# Patient Record
Sex: Male | Born: 1985 | Race: White | Hispanic: No | Marital: Single | Smoking: Never smoker
Health system: Southern US, Community
[De-identification: ages and names within clinical notes are randomized; demographics above are authoritative.]

## PROBLEM LIST (undated history)

## (undated) DIAGNOSIS — R0609 Other forms of dyspnea: Secondary | ICD-10-CM

## (undated) DIAGNOSIS — R0789 Other chest pain: Secondary | ICD-10-CM

## (undated) DIAGNOSIS — N390 Urinary tract infection, site not specified: Secondary | ICD-10-CM

## (undated) DIAGNOSIS — E781 Pure hyperglyceridemia: Secondary | ICD-10-CM

## (undated) DIAGNOSIS — E669 Obesity, unspecified: Secondary | ICD-10-CM

## (undated) HISTORY — DX: Obesity, unspecified: E66.9

## (undated) HISTORY — DX: Urinary tract infection, site not specified: N39.0

## (undated) HISTORY — DX: Other chest pain: R07.89

## (undated) HISTORY — DX: Other forms of dyspnea: R06.09

## (undated) HISTORY — DX: Pure hyperglyceridemia: E78.1

## (undated) HISTORY — PX: OTHER SURGICAL HISTORY: SHX169

---

## 2017-07-05 ENCOUNTER — Encounter: Payer: Self-pay | Admitting: Cardiology

## 2017-07-05 ENCOUNTER — Ambulatory Visit (INDEPENDENT_AMBULATORY_CARE_PROVIDER_SITE_OTHER): Payer: Self-pay | Admitting: Cardiology

## 2017-07-05 ENCOUNTER — Encounter: Payer: Self-pay | Admitting: *Deleted

## 2017-07-05 DIAGNOSIS — R0789 Other chest pain: Secondary | ICD-10-CM | POA: Insufficient documentation

## 2017-07-05 DIAGNOSIS — R0600 Dyspnea, unspecified: Secondary | ICD-10-CM

## 2017-07-05 DIAGNOSIS — R0609 Other forms of dyspnea: Secondary | ICD-10-CM

## 2017-07-05 HISTORY — DX: Other forms of dyspnea: R06.09

## 2017-07-05 HISTORY — DX: Dyspnea, unspecified: R06.00

## 2017-07-05 HISTORY — DX: Other chest pain: R07.89

## 2017-07-05 MED ORDER — METOPROLOL TARTRATE 25 MG PO TABS: 25.0000 mg | ORAL_TABLET | Freq: Two times a day (BID) | ORAL | 3 refills | Status: DC

## 2017-07-05 NOTE — Patient Instructions (Signed)
Medication Instructions:  Your physician has recommended you make the following change in your medication:  START metoprolol tartrate (lopressor) 25 mg twice daily  Labwork: Your physician recommends that you return for lab work today: lipid panel  Testing/Procedures: You had an EKG today.   Your physician has requested that you have en exercise stress myoview. For further information please visit [email or web address]. Please follow instruction sheet, as given.    Follow-Up: Your physician recommends that you schedule a follow-up appointment in: 1 month.  Any Other Special Instructions Will Be Listed Below (If Applicable).     If you need a refill on your cardiac medications before your next appointment, please call your pharmacy.

## 2017-07-05 NOTE — Progress Notes (Signed)
Cardiology Consultation:    Date:  07/05/2017   ID:  David Schultz, DOB 12/07/1985, MRN [id number]  PCP:  Patient, No Pcp Per  Cardiologist:  Gypsy Balsam, MD   Referring MD: No ref. provider found   Chief Complaint  Patient presents with  . Chest Pain    x2 weeks  I am having chest pain  History of Present Illness:    David Schultz is a 32 y.o. male who is being seen today for the evaluation of chest pain at the request of No ref. provider found.  He started experiencing chest pain about 6 months ago.  He is a Corporate investment banker and initially pain was when he was trying to do some heavy work.  Described pain as pressure located on left side of his chest.  Associated with mild shortness of breath but no sweating no palpitations.  Interestingly been admitted last 2-3 weeks pain became constant.  He wakes up with this he goes to sleep with that he grades the pain as 3-4 and scale up to 10.  There are no relieving or aggravating factors.  Pressing chest wall partially reproduce the pain.  On top of that he describes duration when he eats and he goes to bed and then pain get worse.  Did not try any antacids.  He is on one baby aspirin every single day.  Again he works physically he have to pace his work otherwise he would get tired. His risk factors for coronary artery disease include family history of premature vascular problems otherwise he does not smoke he does not have hypertension he is not sure what his cholesterol is.  No past medical history on file.    Current Medications: No outpatient medications have been marked as taking for the 07/05/17 encounter (Office Visit) with Georgeanna Lea, MD.     Allergies:   Patient has no allergy information on record.   Social History   Socioeconomic History  . Marital status: Single    Spouse name: Not on file  . Number of children: Not on file  . Years of education: Not on file  . Highest education level: Not on file  Social  Needs  . Financial resource strain: Not on file  . Food insecurity - worry: Not on file  . Food insecurity - inability: Not on file  . Transportation needs - medical: Not on file  . Transportation needs - non-medical: Not on file  Occupational History  . Not on file  Tobacco Use  . Smoking status: Not on file  Substance and Sexual Activity  . Alcohol use: Not on file  . Drug use: Not on file  . Sexual activity: Not on file  Other Topics Concern  . Not on file  Social History Narrative  . Not on file     Family History: The patient's family history is not on file. ROS:   Please see the history of present illness.    All 14 point review of systems negative except as described per history of present illness.  EKGs/Labs/Other Studies Reviewed:    The following studies were reviewed today:   EKG:  EKG is  ordered today.  The ekg ordered today demonstrates EKG showed normal sinus rhythm normal P interval, poor R wave progression anterior precordium, no ST-T segment changes  Recent Labs: No results found for requested labs within last 8760 hours.  Recent Lipid Panel No results found for: CHOL, TRIG, HDL, CHOLHDL, VLDL, LDLCALC, LDLDIRECT  Physical Exam:    VS:  There were no vitals taken for this visit.    Wt Readings from Last 3 Encounters:  No data found for Wt     GEN:  Well nourished, well developed in no acute distress HEENT: Normal NECK: No JVD; No carotid bruits LYMPHATICS: No lymphadenopathy CARDIAC: RRR, no murmurs, no rubs, no gallops RESPIRATORY:  Clear to auscultation without rales, wheezing or rhonchi  ABDOMEN: Soft, non-tender, non-distended MUSCULOSKELETAL:  No edema; No deformity  SKIN: Warm and dry NEUROLOGIC:  Alert and oriented x 3 PSYCHIATRIC:  Normal affect   ASSESSMENT:    1. Atypical chest pain   2. Dyspnea on exertion    PLAN:    In order of problems listed above:  1. Atypical chest pain in this gentleman with no risk factors for  coronary artery disease except for family history of premature vascular problem however his father was a smoker and also diabetic.  Is already on baby aspirin which I will continue.  I put him on metoprolol 25 twice daily.  We will schedule him to have plain EKG treadmill stress test.  I get suspicion that it may be related to his GI issues therefore I asked him to start taking omeprazole 20 mg twice daily.  We will check his fasting lipid profile today to assess risks.  Him back in my office in about a month. 2. Dyspnea on exertion: Will do stress test to see his ability to exercise.  He tells me that he does not have any insurance he requested cheapest possible test.  Hopefully doing plain EKG treadmill stress test will be sufficient.   Medication Adjustments/Labs and Tests Ordered: Current medicines are reviewed at length with the patient today.  Concerns regarding medicines are outlined above.  No orders of the defined types were placed in this encounter.  No orders of the defined types were placed in this encounter.   Signed, Georgeanna Leaobert J. Panzy Bubeck, MD, [facility]ACC. 07/05/2017 9:11 AM    [facility]

## 2017-07-06 LAB — LIPID PANEL
CHOLESTEROL TOTAL: 180 mg/dL (ref 100–199)
Chol/HDL Ratio: 5.1 ratio — ABNORMAL HIGH (ref 0.0–5.0)
HDL: 35 mg/dL — ABNORMAL LOW (ref >39)
LDL Calculated: 102 mg/dL — ABNORMAL HIGH (ref 0–99)
Triglycerides: 214 mg/dL — ABNORMAL HIGH (ref 0–149)
VLDL CHOLESTEROL CAL: 43 mg/dL — AB (ref 5–40)

## 2017-07-13 ENCOUNTER — Ambulatory Visit (INDEPENDENT_AMBULATORY_CARE_PROVIDER_SITE_OTHER): Payer: Self-pay

## 2017-07-13 DIAGNOSIS — R0789 Other chest pain: Secondary | ICD-10-CM

## 2017-07-13 DIAGNOSIS — R0609 Other forms of dyspnea: Secondary | ICD-10-CM

## 2017-07-13 LAB — EXERCISE TOLERANCE TEST
CHL CUP RESTING HR STRESS: 94 {beats}/min
CHL RATE OF PERCEIVED EXERTION: 17
CSEPED: 5 min
Estimated workload: 7 METS
Exercise duration (sec): 51 s
MPHR: 188 {beats}/min
Peak HR: 173 {beats}/min
Percent HR: 92 %

## 2017-07-16 ENCOUNTER — Ambulatory Visit: Payer: Self-pay | Admitting: Cardiology

## 2017-08-05 ENCOUNTER — Encounter: Payer: Self-pay | Admitting: Cardiology

## 2017-08-05 ENCOUNTER — Encounter: Payer: Self-pay | Admitting: *Deleted

## 2017-08-05 ENCOUNTER — Ambulatory Visit (INDEPENDENT_AMBULATORY_CARE_PROVIDER_SITE_OTHER): Payer: Self-pay | Admitting: Cardiology

## 2017-08-05 VITALS — BP 110/62 | HR 74 | Ht 74.0 in | Wt 332.0 lb

## 2017-08-05 DIAGNOSIS — E781 Pure hyperglyceridemia: Secondary | ICD-10-CM

## 2017-08-05 DIAGNOSIS — R0789 Other chest pain: Secondary | ICD-10-CM

## 2017-08-05 DIAGNOSIS — R0609 Other forms of dyspnea: Secondary | ICD-10-CM

## 2017-08-05 HISTORY — DX: Pure hyperglyceridemia: E78.1

## 2017-08-05 NOTE — Progress Notes (Signed)
Cardiology Office Note:    Date:  08/05/2017   ID:  David Schultz, DOB 06/06/1985, MRN [id number]  PCP:  Patient, No Pcp Per  Cardiologist:  Gypsy Balsamobert Krasowski, MD    Referring MD: No ref. provider found   Chief Complaint  Patient presents with  . 1 month follow up  Doing better  History of Present Illness:    David Schultz is a 32 y.o. male with atypical chest pain.  He does have some risk factors for coronary artery disease therefore we decided to do a stress test.  He walked almost 6 minutes on the treadmill stress test was read as negative.  He is doing better his pain improved dramatically.  He H change the way he eats he stopped eating fried food.  I also gave him proton pump inhibitor that seems to be helping from the beginning I suspected that his symptoms may be related to his stomach.  No past medical history on file.    Current Medications: Current Meds  Medication Sig  . aspirin EC 81 MG tablet Take 81 mg by mouth daily.  . metoprolol tartrate (LOPRESSOR) 25 MG tablet Take 1 tablet (25 mg total) by mouth 2 (two) times daily.     Allergies:   Patient has no known allergies.   Social History   Socioeconomic History  . Marital status: Single    Spouse name: Not on file  . Number of children: Not on file  . Years of education: Not on file  . Highest education level: Not on file  Occupational History  . Not on file  Social Needs  . Financial resource strain: Not on file  . Food insecurity:    Worry: Not on file    Inability: Not on file  . Transportation needs:    Medical: Not on file    Non-medical: Not on file  Tobacco Use  . Smoking status: Never Smoker  . Smokeless tobacco: Never Used  Substance and Sexual Activity  . Alcohol use: No    Frequency: Never  . Drug use: No  . Sexual activity: Not on file  Lifestyle  . Physical activity:    Days per week: Not on file    Minutes per session: Not on file  . Stress: Not on file  Relationships  . Social  connections:    Talks on phone: Not on file    Gets together: Not on file    Attends religious service: Not on file    Active member of club or organization: Not on file    Attends meetings of clubs or organizations: Not on file    Relationship status: Not on file  Other Topics Concern  . Not on file  Social History Narrative  . Not on file     Family History: The patient's family history includes Diabetes in his father. ROS:   Please see the history of present illness.    All 14 point review of systems negative except as described per history of present illness  EKGs/Labs/Other Studies Reviewed:      Recent Labs: No results found for requested labs within last 8760 hours.  Recent Lipid Panel    Component Value Date/Time   CHOL 180 07/05/2017 0938   TRIG 214 (H) 07/05/2017 0938   HDL 35 (L) 07/05/2017 0938   CHOLHDL 5.1 (H) 07/05/2017 0938   LDLCALC 102 (H) 07/05/2017 11910938    Physical Exam:    VS:  BP 110/62   Pulse  74   Ht 6\' 2"  (1.88 m)   Wt (!) 332 lb (150.6 kg)   SpO2 96%   BMI 42.63 kg/m     Wt Readings from Last 3 Encounters:  08/05/17 (!) 332 lb (150.6 kg)  07/05/17 (!) 333 lb 6.4 oz (151.2 kg)     GEN:  Well nourished, well developed in no acute distress HEENT: Normal NECK: No JVD; No carotid bruits LYMPHATICS: No lymphadenopathy CARDIAC: RRR, no murmurs, no rubs, no gallops RESPIRATORY:  Clear to auscultation without rales, wheezing or rhonchi  ABDOMEN: Soft, non-tender, non-distended MUSCULOSKELETAL:  No edema; No deformity  SKIN: Warm and dry LOWER EXTREMITIES: no swelling NEUROLOGIC:  Alert and oriented x 3 PSYCHIATRIC:  Normal affect   ASSESSMENT:    1. Atypical chest pain   2. Dyspnea on exertion   3. Hypertriglyceridemia    PLAN:    In order of problems listed above:  1. Atypical chest pain: Doubt very much is related to coronary artery disease stress test was negative.  I advised him on a healthy lifestyle we talked about  exercises on the regular basis, we talked about good diet I recommended Mediterranean diet. 2. Dyspnea on exertion: Probably multifactorial obesity play significant role here.  His stress test was negative he walk on a 6 minutes on the treadmill though. 3. Hypertriglyceridemia.  With talking length about good diet which includes reducing carbohydrates and exercises on the regular basis.  His cholesterol need to be checked within next 6-12 months.   Medication Adjustments/Labs and Tests Ordered: Current medicines are reviewed at length with the patient today.  Concerns regarding medicines are outlined above.  No orders of the defined types were placed in this encounter.  Medication changes: No orders of the defined types were placed in this encounter.   Signed, Georgeanna Lea, MD, [facility] 08/05/2017 9:30 AM    [facility]

## 2017-08-05 NOTE — Patient Instructions (Signed)

## 2017-08-26 ENCOUNTER — Telehealth: Payer: Self-pay | Admitting: *Deleted

## 2017-08-26 NOTE — Telephone Encounter (Signed)
Dr. Bing Matter advised to call and check on patient a few weeks after last office visit on 08/05/2017. Patient states he is doing good and has no questions or concerns at this time.

## 2017-12-15 ENCOUNTER — Telehealth: Payer: Self-pay | Admitting: Cardiology

## 2017-12-15 NOTE — Telephone Encounter (Signed)
Please call regarding having gallbladder testing done. Doctor discussed with him at last visit.

## 2017-12-16 ENCOUNTER — Ambulatory Visit (INDEPENDENT_AMBULATORY_CARE_PROVIDER_SITE_OTHER): Payer: Self-pay | Admitting: Cardiology

## 2017-12-16 VITALS — BP 126/86 | HR 87 | Ht 74.0 in | Wt 339.1 lb

## 2017-12-16 DIAGNOSIS — E781 Pure hyperglyceridemia: Secondary | ICD-10-CM

## 2017-12-16 DIAGNOSIS — R0789 Other chest pain: Secondary | ICD-10-CM

## 2017-12-16 DIAGNOSIS — R0609 Other forms of dyspnea: Secondary | ICD-10-CM

## 2017-12-16 DIAGNOSIS — R1011 Right upper quadrant pain: Secondary | ICD-10-CM | POA: Insufficient documentation

## 2017-12-16 NOTE — Patient Instructions (Signed)
Medication Instructions:  Your physician recommends that you continue on your current medications as directed. Please refer to the Current Medication list given to you today.   Labwork: none  Testing/Procedures: You will have a RUQ ultrasound to assess the gallbladder. You will be contacted to schedule this appointment.   Follow-Up: Your physician wants you to follow-up in 5 months. You will receive a reminder letter in the mail two months in advance. If you don't receive a letter, please call our office to schedule the follow-up appointment.   Any Other Special Instructions Will Be Listed Below (If Applicable).     If you need a refill on your cardiac medications before your next appointment, please call your pharmacy.   Abdominal or Pelvic Ultrasound An ultrasound is a test that takes pictures of the inside of the body. An abdominal ultrasound takes pictures of your belly. A pelvic ultrasound takes pictures of the area between your belly and thighs. An ultrasound may be done to check an organ or look for problems. If you are pregnant, it may be done to learn about your baby. What happens before the procedure?  Follow instructions from your doctor about what you cannot eat or drink.  Wear clothing that is easy to wash. Gel from the test might get on your clothes. What happens during the procedure?  A gel will be put on your skin. It may feel cool.  A wand called a transducer will be put on your skin.  The wand will take pictures. They will show on small TV screens. What happens after the procedure?  Get your test results. Ask your doctor or the department that did the test when your results will be ready.  Keep follow-up visits as told by your doctor. This is important. This information is not intended to replace advice given to you by your health care provider. Make sure you discuss any questions you have with your health care provider. Document Released: 05/09/2010  Document Revised: 12/05/2015 Document Reviewed: 12/28/2014 Elsevier Interactive Patient Education  [employer]2018 [employer] Inc.

## 2017-12-16 NOTE — Telephone Encounter (Signed)
Pt states that Dr. Bing MatterKrasowski had discussed possible gallbladder testing during last visit. Pt encouraged to go to PCP however he states he does not have a PCP. Pt states he would like to see Dr. Bing MatterKrasowski as soon as possible regarding their previous discussion. Appointment made for today for pt at our [place] office.

## 2017-12-16 NOTE — Progress Notes (Signed)
Cardiology Office Note:    Date:  12/16/2017   ID:  David Schultz, DOB 11-Feb-1986, MRN [id number]  PCP:  Patient, No Pcp Per  Cardiologist:  Gypsy Balsam, MD    Referring MD: No ref. provider found   Chief Complaint  Patient presents with  . Follow-up    to discuss right sided pain  I have a pain in the right side of my chest  History of Present Illness:    David Schultz is a 32 y.o. male who was referred originally to me because of chest pain.  He did have a stress test which was negative plan we concentrated our efforts on modification of his risk factors for coronary artery disease.  Today he comes to my office complaining of pain in the right upper quadrant of his abdomen started 2 days ago initially intermittent getting worse with greasy food now became kind of more constant.  Pressing in this area does not reproduce the pain.  He is slightly tender in the epigastrium.  He does not have any guarding he does not have any rebound tenderness.  Past Medical History:  Diagnosis Date  . Atypical chest pain 07/05/2017  . Dyspnea on exertion 07/05/2017  . Hypertriglyceridemia 08/05/2017    Past Surgical History:  Procedure Laterality Date  . Sweat Gland removal      Current Medications: No outpatient medications have been marked as taking for the 12/16/17 encounter (Office Visit) with Georgeanna Lea, MD.     Allergies:   Patient has no known allergies.   Social History   Socioeconomic History  . Marital status: Single    Spouse name: Not on file  . Number of children: Not on file  . Years of education: Not on file  . Highest education level: Not on file  Occupational History  . Not on file  Social Needs  . Financial resource strain: Not on file  . Food insecurity:    Worry: Not on file    Inability: Not on file  . Transportation needs:    Medical: Not on file    Non-medical: Not on file  Tobacco Use  . Smoking status: Never Smoker  . Smokeless tobacco: Never  Used  Substance and Sexual Activity  . Alcohol use: No    Frequency: Never  . Drug use: No  . Sexual activity: Not on file  Lifestyle  . Physical activity:    Days per week: Not on file    Minutes per session: Not on file  . Stress: Not on file  Relationships  . Social connections:    Talks on phone: Not on file    Gets together: Not on file    Attends religious service: Not on file    Active member of club or organization: Not on file    Attends meetings of clubs or organizations: Not on file    Relationship status: Not on file  Other Topics Concern  . Not on file  Social History Narrative  . Not on file     Family History: The patient's family history includes Diabetes in his father. ROS:   Please see the history of present illness.    All 14 point review of systems negative except as described per history of present illness  EKGs/Labs/Other Studies Reviewed:      Recent Labs: No results found for requested labs within last 8760 hours.  Recent Lipid Panel    Component Value Date/Time   CHOL 180 07/05/2017 0938  TRIG 214 (H) 07/05/2017 0938   HDL 35 (L) 07/05/2017 0938   CHOLHDL 5.1 (H) 07/05/2017 0938   LDLCALC 102 (H) 07/05/2017 [id number]    Physical Exam:    VS:  BP 126/86 (BP Location: Right Arm, Patient Position: Sitting, Cuff Size: Large)   Pulse 87   Ht 6\' 2"  (1.88 m)   Wt (!) 339 lb 1.9 oz (153.8 kg)   SpO2 97%   BMI 43.54 kg/m     Wt Readings from Last 3 Encounters:  12/16/17 (!) 339 lb 1.9 oz (153.8 kg)  08/05/17 (!) 332 lb (150.6 kg)  07/05/17 (!) 333 lb 6.4 oz (151.2 kg)     GEN:  Well nourished, well developed in no acute distress HEENT: Normal NECK: No JVD; No carotid bruits LYMPHATICS: No lymphadenopathy CARDIAC: RRR, no murmurs, no rubs, no gallops RESPIRATORY:  Clear to auscultation without rales, wheezing or rhonchi  ABDOMEN: Soft, non-tender, non-distended MUSCULOSKELETAL:  No edema; No deformity  SKIN: Warm and dry LOWER  EXTREMITIES: no swelling NEUROLOGIC:  Alert and oriented x 3 PSYCHIATRIC:  Normal affect   ASSESSMENT:    1. Atypical chest pain   2. Dyspnea on exertion   3. Hypertriglyceridemia   4. Right upper quadrant abdominal pain    PLAN:    In order of problems listed above:  1. Atypical chest pain.  So far all work-up negative. 2. Right upper abdominal quadrant pain no rebound tenderness no worrisome features of the pain so far.  I suggest to have ultrasounds of his gallbladder.  We will try to schedule him for it.  We will also look at his liver he may have some liver steatosis.  I also advised him to find primary care physician to continue investigation. 3. Dyspnea on exertion denies having any 4. High triglyceride: He changed some diet but it looks like he gained some weight since I seen him last time which is obviously disappointing.   Medication Adjustments/Labs and Tests Ordered: Current medicines are reviewed at length with the patient today.  Concerns regarding medicines are outlined above.  No orders of the defined types were placed in this encounter.  Medication changes: No orders of the defined types were placed in this encounter.   Signed, Georgeanna Leaobert J. Reily Ilic, MD, [facility] 12/16/2017 3:34 PM    [facility]

## 2017-12-21 ENCOUNTER — Ambulatory Visit (HOSPITAL_BASED_OUTPATIENT_CLINIC_OR_DEPARTMENT_OTHER)
Admission: RE | Admit: 2017-12-21 | Discharge: 2017-12-21 | Disposition: A | Discharge status: Home / Self Care | Payer: Self-pay | Source: Ambulatory Visit | Attending: Cardiology | Admitting: Cardiology

## 2017-12-21 DIAGNOSIS — R1011 Right upper quadrant pain: Secondary | ICD-10-CM | POA: Insufficient documentation

## 2017-12-24 ENCOUNTER — Telehealth: Payer: Self-pay | Admitting: *Deleted

## 2017-12-24 NOTE — Telephone Encounter (Signed)
Pt had U/S of gall bladder and would like results please. Results are in chart. Please advise.

## 2017-12-24 NOTE — Telephone Encounter (Signed)
Patient informed of results. Per Dr. Bing Matter patient has fatty liver however gallbadder looks okay.Patient informed.

## 2017-12-24 NOTE — Telephone Encounter (Signed)
Messaged Dr.Krasowski for results. Will follow up once he responds.

## 2018-01-04 ENCOUNTER — Telehealth: Payer: Self-pay | Admitting: *Deleted

## 2018-01-04 NOTE — Telephone Encounter (Signed)
Records Release faxed to us to release this pts notes and [facility] to [facility]. Records faxed over to [phone]

## 2019-03-13 ENCOUNTER — Encounter: Payer: Self-pay | Admitting: Gastroenterology

## 2019-04-03 ENCOUNTER — Ambulatory Visit: Payer: Self-pay | Admitting: Gastroenterology

## 2019-04-03 ENCOUNTER — Encounter: Payer: Self-pay | Admitting: Gastroenterology

## 2019-04-03 ENCOUNTER — Other Ambulatory Visit: Payer: Self-pay

## 2019-04-03 VITALS — BP 126/80 | HR 92 | Temp 97.8°F | Ht 74.0 in | Wt 320.1 lb

## 2019-04-03 DIAGNOSIS — K219 Gastro-esophageal reflux disease without esophagitis: Secondary | ICD-10-CM

## 2019-04-03 DIAGNOSIS — K760 Fatty (change of) liver, not elsewhere classified: Secondary | ICD-10-CM

## 2019-04-03 DIAGNOSIS — R1011 Right upper quadrant pain: Secondary | ICD-10-CM

## 2019-04-03 MED ORDER — PANTOPRAZOLE SODIUM 40 MG PO TBEC: 40.0000 mg | DELAYED_RELEASE_TABLET | Freq: Every day | ORAL | 2 refills | Status: AC

## 2019-04-03 NOTE — Patient Instructions (Signed)
If you are age 33 or older, your body mass index should be between 23-30. Your Body mass index is 41.1 kg/m. If this is out of the aforementioned range listed, please consider follow up with your Primary Care Provider.  If you are age 46 or younger, your body mass index should be between 19-25. Your Body mass index is 41.1 kg/m. If this is out of the aformentioned range listed, please consider follow up with your Primary Care Provider.   Please go to the lab at [facility] ([place] You will need to go to level "B", you do not need an appointment for this. Hours available are 7:30 am - 4:30 pm.   You have been scheduled for an abdominal ultrasound at [facility] (1st floor) on 04/07/19 at 9am. Please arrive 15 minutes prior to your appointment for registration. Make certain not to have anything to eat or drink 6 hours prior to your appointment. Should you need to reschedule your appointment, please contact radiology at [phone]. This test typically takes about 30 minutes to perform.   We have sent the following medications to your pharmacy for you to pick up at your convenience: Protonix   Thank you,  Dr. Jackquline Denmark

## 2019-04-03 NOTE — Progress Notes (Signed)
Chief Complaint:   Referring Provider:  No ref. provider found      ASSESSMENT AND PLAN;   #1.  RUQ pain. RUQ [place] - fatty liver 12/2017. D/d PUD, GERD, gastritis, nonulcer dyspepsia, gastroparesis, musculoskeletal etiology, r/o gallbladder or pancreatic problems.  #2. GERD with H/O atypical chest pains, neg stress test (Dr Kirtland Bouchard)  #3.  Fatty liver.  Plan: - Check CBC, CMP, lipase, TSH and fasting lipid profile. - [place] complete. If above WU is neg and he still has problems, would recommend HIDA scan with EF. - Given his age, trial of Protonix 40 mg p.o. once a day (#30), 2 refills. - He will call us in the next 2-3 weeks.  If still with problems, EGD for further evaluation. - Can use ben-gay/icy hot for musculoskeletal component. - I have reassured the patient. - Continued gradual weight loss as he has been doing.  This will be helpful for fatty liver. - FU once above is complete.  He has appointment with Dr. Kirtland Bouchard in January (2021)  HPI:    David Schultz is a 33 y.o. male  RUQ pain x 1 yrs, then quit, then started again over the last 1 month.  Getting worse. Sharp, nonradiating, gets worse after eating fatty foods.  No jaundice dark urine or pale stools.  He would occasionally have heartburn previously but not too bad recently.  No odynophagia or dysphagia.  The pain would occasionally wake him up.  Sister had gallbladder problems.  Denies having any diarrhea or constipation.  No fever chills or night sweats.  No odynophagia or dysphagia.  No nausea/vomiting.  Has lost weight while staying away from fatty foods.  Seen by Dr. Kirtland Bouchard for atypical chest pain-had negative stress test.  Had a limited right upper quad ultrasound in 2019 which showed fatty liver.  He is quite concerned about gallbladder problems.  He does not have any insurance and wants to get minimum invasive tests performed currently.  Past Medical History:  Diagnosis Date  . Atypical chest pain 07/05/2017  . Dyspnea on  exertion 07/05/2017  . Hypertriglyceridemia 08/05/2017  . Obesity   . UTI (urinary tract infection)     Past Surgical History:  Procedure Laterality Date  . Sweat Gland removal     on back of head. Happened in middle school     Family History  Problem Relation Age of Onset  . Diabetes Father   . Prostate cancer Paternal Grandfather   . Colon cancer Neg Hx   . Esophageal cancer Neg Hx     Social History   Tobacco Use  . Smoking status: Never Smoker  . Smokeless tobacco: Never Used  Substance Use Topics  . Alcohol use: No  . Drug use: No    Current Outpatient Medications  Medication Sig Dispense Refill  . Multiple Vitamins-Minerals (MULTIVITAMIN ADULTS PO) Take 2 tablets by mouth daily.     No current facility-administered medications for this visit.    No Known Allergies  Review of Systems:  Constitutional: Denies fever, chills, diaphoresis, appetite change and fatigue.  HEENT: neg   Respiratory: Denies SOB, DOE, cough, chest tightness,  and wheezing.   Cardiovascular: Denies chest pain, palpitations and leg swelling.  Genitourinary: Denies dysuria, urgency, frequency, hematuria, flank pain and difficulty urinating.  Musculoskeletal: Denies myalgias, back pain, joint swelling, arthralgias and gait problem.  Skin: No rash.  Neurological: Denies dizziness, seizures, syncope, weakness, light-headedness, numbness and headaches.  Hematological: Denies adenopathy. Easy bruising, personal or  family bleeding history  Psychiatric/Behavioral: No anxiety or depression     Physical Exam:    BP 126/80   Pulse 92   Temp 97.8 F (36.6 C)   Ht 6\' 2"  (1.88 m)   Wt (!) 320 lb 2 oz (145.2 kg)   BMI 41.10 kg/m  Wt Readings from Last 3 Encounters:  04/03/19 (!) 320 lb 2 oz (145.2 kg)  12/16/17 (!) 339 lb 1.9 oz (153.8 kg)  08/05/17 (!) 332 lb (150.6 kg)   Constitutional:  Well-developed, in no acute distress. Psychiatric: Normal mood and affect. Behavior is normal.  HEENT: Wearing mask.  No icterus. Cardiovascular: Normal. Pulmonary/chest: Clear. Abdominal: Soft, nondistended.  Minimal right upper quadrant abdominal tenderness.  No rebound.  Seems to have some element of musculoskeletal pain as well.  Bowel sounds active throughout. There are no masses palpable. No hepatomegaly. Rectal:  defered Neurological: Alert and oriented to person place and time. Skin: Skin is warm and dry. No rashes noted.  Data Reviewed: I have personally reviewed following labs and imaging studies Right upper quad ultrasound report was reviewed.   Carmell Austria, MD 04/03/2019, 10:22 AM  Cc: No ref. provider found

## 2019-04-03 NOTE — Addendum Note (Signed)
Addended by: Karena Addison on: 04/03/2019 03:44 PM   Modules accepted: Orders

## 2019-04-07 ENCOUNTER — Other Ambulatory Visit (INDEPENDENT_AMBULATORY_CARE_PROVIDER_SITE_OTHER): Payer: Self-pay

## 2019-04-07 ENCOUNTER — Other Ambulatory Visit: Payer: Self-pay

## 2019-04-07 ENCOUNTER — Ambulatory Visit (HOSPITAL_BASED_OUTPATIENT_CLINIC_OR_DEPARTMENT_OTHER)
Admission: RE | Admit: 2019-04-07 | Discharge: 2019-04-07 | Disposition: A | Discharge status: Home / Self Care | Payer: Self-pay | Source: Ambulatory Visit | Attending: Gastroenterology | Admitting: Gastroenterology

## 2019-04-07 DIAGNOSIS — R1011 Right upper quadrant pain: Secondary | ICD-10-CM | POA: Insufficient documentation

## 2019-04-07 DIAGNOSIS — K760 Fatty (change of) liver, not elsewhere classified: Secondary | ICD-10-CM

## 2019-04-07 DIAGNOSIS — K219 Gastro-esophageal reflux disease without esophagitis: Secondary | ICD-10-CM

## 2019-04-07 LAB — COMPREHENSIVE METABOLIC PANEL
ALT: 51 U/L (ref 0–53)
AST: 23 U/L (ref 0–37)
Albumin: 4.3 g/dL (ref 3.5–5.2)
Alkaline Phosphatase: 70 U/L (ref 39–117)
BUN: 13 mg/dL (ref 6–23)
CO2: 25 mEq/L (ref 19–32)
Calcium: 9.2 mg/dL (ref 8.4–10.5)
Chloride: 104 mEq/L (ref 96–112)
Creatinine, Ser: 0.98 mg/dL (ref 0.40–1.50)
GFR: 87.66 mL/min (ref >60.00)
Glucose, Bld: 298 mg/dL — ABNORMAL HIGH (ref 70–99)
Potassium: 3.8 mEq/L (ref 3.5–5.1)
Sodium: 137 mEq/L (ref 135–145)
Total Bilirubin: 0.5 mg/dL (ref 0.2–1.2)
Total Protein: 7.2 g/dL (ref 6.0–8.3)

## 2019-04-07 LAB — CBC WITH DIFFERENTIAL/PLATELET
Basophils Absolute: 0.1 10*3/uL (ref 0.0–0.1)
Basophils Relative: 1.2 % (ref 0.0–3.0)
Eosinophils Absolute: 0.1 10*3/uL (ref 0.0–0.7)
Eosinophils Relative: 1.4 % (ref 0.0–5.0)
HCT: 47.2 % (ref 39.0–52.0)
Hemoglobin: 16.2 g/dL (ref 13.0–17.0)
Lymphocytes Relative: 28.9 % (ref 12.0–46.0)
Lymphs Abs: 2.5 10*3/uL (ref 0.7–4.0)
MCHC: 34.4 g/dL (ref 30.0–36.0)
MCV: 90 fl (ref 78.0–100.0)
Monocytes Absolute: 0.4 10*3/uL (ref 0.1–1.0)
Monocytes Relative: 5 % (ref 3.0–12.0)
Neutro Abs: 5.4 10*3/uL (ref 1.4–7.7)
Neutrophils Relative %: 63.5 % (ref 43.0–77.0)
Platelets: 313 10*3/uL (ref 150.0–400.0)
RBC: 5.25 Mil/uL (ref 4.22–5.81)
RDW: 12.8 % (ref 11.5–15.5)
WBC: 8.6 10*3/uL (ref 4.0–10.5)

## 2019-04-07 LAB — LIPID PANEL
Cholesterol: 192 mg/dL (ref 0–200)
HDL: 31.4 mg/dL — ABNORMAL LOW (ref >39.00)
NonHDL: 160.86
Total CHOL/HDL Ratio: 6
Triglycerides: 297 mg/dL — ABNORMAL HIGH (ref 0.0–149.0)
VLDL: 59.4 mg/dL — ABNORMAL HIGH (ref 0.0–40.0)

## 2019-04-07 LAB — TSH: TSH: 0.77 u[IU]/mL (ref 0.35–4.50)

## 2019-04-07 LAB — LDL CHOLESTEROL, DIRECT: Direct LDL: 123 mg/dL

## 2019-04-07 LAB — LIPASE: Lipase: 20 U/L (ref 11.0–59.0)

## 2019-04-08 NOTE — Progress Notes (Signed)
Please inform the patient. Glucose significantly elevated, triglycerides elevated -he likely has diabetes (it does run in his family-dad had it as well) All rest of results normal. US-fatty liver, normal gallbladder Plan: -Bre, can add hemoglobin A1c if possible to the blood work.  Not sure if it is possible. -He should get in touch with Dr. Wendie Agreste to get evaluated for diabetes Send report to patient and Dr Wendie Agreste

## 2019-04-10 ENCOUNTER — Telehealth: Payer: Self-pay | Admitting: Gastroenterology

## 2019-04-10 DIAGNOSIS — K219 Gastro-esophageal reflux disease without esophagitis: Secondary | ICD-10-CM

## 2019-04-10 DIAGNOSIS — K760 Fatty (change of) liver, not elsewhere classified: Secondary | ICD-10-CM

## 2019-04-10 DIAGNOSIS — R1011 Right upper quadrant pain: Secondary | ICD-10-CM

## 2019-04-10 NOTE — Telephone Encounter (Signed)
Please review additional documentation concerning this patient- 

## 2019-04-10 NOTE — Telephone Encounter (Signed)
Pt inquired about [place] and blood test results.

## 2019-04-10 NOTE — Telephone Encounter (Signed)
[name] pt

## 2019-04-11 ENCOUNTER — Telehealth: Payer: Self-pay | Admitting: Gastroenterology

## 2019-04-11 NOTE — Telephone Encounter (Signed)
Pt is calling and would like his HIDA scan rescheduled at the hospital

## 2019-04-11 NOTE — Telephone Encounter (Signed)
Did he Dr Wendie Agreste (for ?? DM). That should be ASAP HIDA with EF if still with problems. If still with problems, needs EGD.  RG

## 2019-04-11 NOTE — Telephone Encounter (Signed)
Per last OV notation-HIDA is next step in plan of care along with EGD-please clarify which is appropriate for the patient at this time FYI- patient does not have insurance at this time-

## 2019-04-11 NOTE — Telephone Encounter (Signed)
Patient called wants to schedule a HIDA scan asap states he is in pain.

## 2019-04-12 NOTE — Telephone Encounter (Signed)
Please see additional information regarding this patient

## 2019-04-12 NOTE — Telephone Encounter (Signed)
Patient has been scheduled 04/26/2019 at 8:00 am at [facility] for his HIDA scan; patient is to arrive at 7:45 am; NPO after MDN; no opiate meds; patient has been called and notified of this information and advised of number to call to reschedule/cancel this appt at [phone]; Patient advised to call back to the office at [phone] should questions/concerns arise;  Patient verbalized understanding of information/instructions;

## 2019-04-12 NOTE — Telephone Encounter (Signed)
Attempted to reach patient- unable to leave a VM as mailbox is full-will attempt to reach patient at a later date/time;   HIDA scan order has been placed in Epic-patient can call [phone] to schedule this scan;

## 2019-04-25 ENCOUNTER — Ambulatory Visit (INDEPENDENT_AMBULATORY_CARE_PROVIDER_SITE_OTHER): Payer: Self-pay | Admitting: Cardiology

## 2019-04-25 ENCOUNTER — Encounter: Payer: Self-pay | Admitting: Cardiology

## 2019-04-25 ENCOUNTER — Other Ambulatory Visit: Payer: Self-pay

## 2019-04-25 VITALS — BP 148/84 | HR 105 | Ht 74.0 in | Wt 313.0 lb

## 2019-04-25 DIAGNOSIS — E781 Pure hyperglyceridemia: Secondary | ICD-10-CM

## 2019-04-25 DIAGNOSIS — R1011 Right upper quadrant pain: Secondary | ICD-10-CM

## 2019-04-25 DIAGNOSIS — R0789 Other chest pain: Secondary | ICD-10-CM

## 2019-04-25 NOTE — Patient Instructions (Signed)

## 2019-04-25 NOTE — Progress Notes (Signed)
Cardiology Office Note:    Date:  04/25/2019   ID:  David Schultz, DOB 1986-02-24, MRN [id number]  PCP:  Nonnie Done., MD  Cardiologist:  Gypsy Balsam, MD    Referring MD: Nonnie Done., MD   Chief Complaint  Patient presents with  . Chest Pain  Still having some epigastric pain  History of Present Illness:    David Schultz is a 34 y.o. male who was referred to [place] because of epigastric discomfort the discomfort is located especially on the right side of his belly which happen after eating greasy food.  He did have a stress test done in March 2019 he walk 5 minutes 51 seconds on the treadmill stress test was negative.  We concentrating our efforts on investigating his gallbladder as a potential source of his symptoms story would suggest that this is the culprit.  He is seeing gastroenterologist, Dr. Chales Abrahams who did ultrasounds of his gallbladder so far negative, he does have HIDA scan scheduled for tomorrow.  I did check his fasting lipid profile he is LDL was mildly elevated.  He does not smoke.  Still work hard and try to walk 1 mile every day.  Does have exertional symptoms  Past Medical History:  Diagnosis Date  . Atypical chest pain 07/05/2017  . Dyspnea on exertion 07/05/2017  . Hypertriglyceridemia 08/05/2017  . Obesity   . UTI (urinary tract infection)     Past Surgical History:  Procedure Laterality Date  . Sweat Gland removal     on back of head. Happened in middle school     Current Medications: Current Meds  Medication Sig  . FIBER PO Take 2 tablets by mouth daily. vitafusion fiber sugar free  . pantoprazole (PROTONIX) 40 MG tablet Take 1 tablet (40 mg total) by mouth daily. 1/2 hour before breakfast.     Allergies:   Patient has no known allergies.   Social History   Socioeconomic History  . Marital status: Single    Spouse name: Not on file  . Number of children: Not on file  . Years of education: Not on file  . Highest education level: Not on file   Occupational History  . Not on file  Tobacco Use  . Smoking status: Never Smoker  . Smokeless tobacco: Never Used  Substance and Sexual Activity  . Alcohol use: No  . Drug use: No  . Sexual activity: Not on file  Other Topics Concern  . Not on file  Social History Narrative  . Not on file   Social Determinants of Health   Financial Resource Strain:   . Difficulty of Paying Living Expenses: Not on file  Food Insecurity:   . Worried About Programme researcher, broadcasting/film/video in the Last Year: Not on file  . Ran Out of Food in the Last Year: Not on file  Transportation Needs:   . Lack of Transportation (Medical): Not on file  . Lack of Transportation (Non-Medical): Not on file  Physical Activity:   . Days of Exercise per Week: Not on file  . Minutes of Exercise per Session: Not on file  Stress:   . Feeling of Stress : Not on file  Social Connections:   . Frequency of Communication with Friends and Family: Not on file  . Frequency of Social Gatherings with Friends and Family: Not on file  . Attends Religious Services: Not on file  . Active Member of Clubs or Organizations: Not on file  . Attends Club  or Organization Meetings: Not on file  . Marital Status: Not on file     Family History: The patient's family history includes Diabetes in his father; Prostate cancer in his paternal grandfather. There is no history of Colon cancer or Esophageal cancer. ROS:   Please see the history of present illness.    All 14 point review of systems negative except as described per history of present illness  EKGs/Labs/Other Studies Reviewed:      Recent Labs: 04/07/2019: ALT 51; BUN 13; Creatinine, Ser 0.98; Hemoglobin 16.2; Platelets 313.0; Potassium 3.8; Sodium 137; TSH 0.77  Recent Lipid Panel    Component Value Date/Time   CHOL 192 04/07/2019 1005   CHOL 180 07/05/2017 0938   TRIG 297.0 (H) 04/07/2019 1005   HDL 31.40 (L) 04/07/2019 1005   HDL 35 (L) 07/05/2017 0938   CHOLHDL 6 04/07/2019  1005   VLDL 59.4 (H) 04/07/2019 1005   LDLCALC 102 (H) 07/05/2017 0938   LDLDIRECT 123.0 04/07/2019 1005    Physical Exam:    VS:  BP (!) 148/84   Pulse (!) 105   Ht 6\' 2"  (1.88 m)   Wt (!) 313 lb (142 kg)   SpO2 95%   BMI 40.19 kg/m     Wt Readings from Last 3 Encounters:  04/25/19 (!) 313 lb (142 kg)  04/03/19 (!) 320 lb 2 oz (145.2 kg)  12/16/17 (!) 339 lb 1.9 oz (153.8 kg)     GEN:  Well nourished, well developed in no acute distress HEENT: Normal NECK: No JVD; No carotid bruits LYMPHATICS: No lymphadenopathy CARDIAC: RRR, no murmurs, no rubs, no gallops RESPIRATORY:  Clear to auscultation without rales, wheezing or rhonchi  ABDOMEN: Soft, non-tender, non-distended MUSCULOSKELETAL:  No edema; No deformity  SKIN: Warm and dry LOWER EXTREMITIES: no swelling NEUROLOGIC:  Alert and oriented x 3 PSYCHIATRIC:  Normal affect   ASSESSMENT:    1. Atypical chest pain   2. Right upper quadrant abdominal pain   3. Hypertriglyceridemia    PLAN:    In order of problems listed above:  1. Atypical chest pain suggesting gallbladder or GI issues.  That being aggressively investigated by GI team 2. Hypertriglyceridemia.  Noted.  We talk again about weight loss and avoiding carbohydrates.  Since this situation still not 100% clear.  I will see him back in about 3 months to see how things are.  Hopefully GI work-up will be fruitful.  Medication Adjustments/Labs and Tests Ordered: Current medicines are reviewed at length with the patient today.  Concerns regarding medicines are outlined above.  Orders Placed This Encounter  Procedures  . EKG 12-Lead   Medication changes: No orders of the defined types were placed in this encounter.   Signed, Park Liter, MD, [facility] 04/25/2019 2:47 PM    [facility]

## 2019-04-26 ENCOUNTER — Encounter (HOSPITAL_COMMUNITY)
Admission: RE | Admit: 2019-04-26 | Discharge: 2019-04-26 | Disposition: A | Discharge status: Home / Self Care | Payer: Self-pay | Source: Ambulatory Visit | Attending: Gastroenterology | Admitting: Gastroenterology

## 2019-04-26 DIAGNOSIS — K760 Fatty (change of) liver, not elsewhere classified: Secondary | ICD-10-CM | POA: Insufficient documentation

## 2019-04-26 DIAGNOSIS — K219 Gastro-esophageal reflux disease without esophagitis: Secondary | ICD-10-CM | POA: Insufficient documentation

## 2019-04-26 DIAGNOSIS — R1011 Right upper quadrant pain: Secondary | ICD-10-CM | POA: Insufficient documentation

## 2019-04-26 MED ORDER — TECHNETIUM TC 99M MEBROFENIN IV KIT
5.2400 | PACK | Freq: Once | INTRAVENOUS | Status: AC | PRN
  Administered 2019-04-26: 5.24 via INTRAVENOUS

## 2019-04-28 ENCOUNTER — Telehealth: Payer: Self-pay | Admitting: Gastroenterology

## 2019-04-28 NOTE — Telephone Encounter (Signed)
Pt inquired about imaging results.

## 2019-04-28 NOTE — Telephone Encounter (Signed)
Please see result note for documentation 

## 2019-07-24 ENCOUNTER — Ambulatory Visit: Payer: Self-pay | Admitting: Cardiology

## 2020-05-19 IMAGING — NM NM HEPATO W/GB/PHARM/[PERSON_NAME]
2 series · 12 of 12 positions shown · non-contrast
Comparison: None.

CLINICAL DATA: Upper abdominal pain with nausea

EXAM:
NUCLEAR MEDICINE HEPATOBILIARY IMAGING WITH GALLBLADDER EF
VIEWS:
Anterior right upper quadrant
RADIOPHARMACEUTICALS:  5.24 mCi Wc-RRm  Choletec IV

[Series 1: biliary · 3.25mm/px · 6 of 60 frames shown]
[frame 6/60]
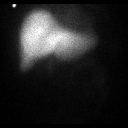
[frame 16/60]
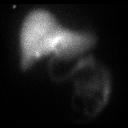
[frame 26/60]
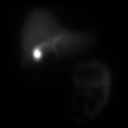
[frame 36/60]
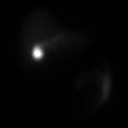
[frame 46/60]
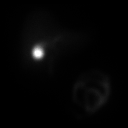
[frame 56/60]
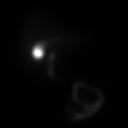

[Series 2: gbef · 3.25mm/px · 6 of 60 frames shown]
[frame 6/60]
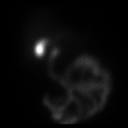
[frame 16/60]
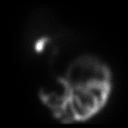
[frame 26/60]
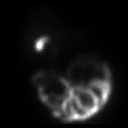
[frame 36/60]
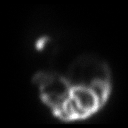
[frame 46/60]
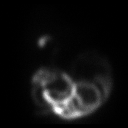
[frame 56/60]
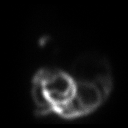

[12 of 12 positions shown; findings below may reference images not displayed]

FINDINGS: Liver uptake of radiotracer is unremarkable. There is prompt
visualization of gallbladder and small bowel, indicating patency of
the cystic and common bile ducts. The patient consumed 8 ounces of
Ensure orally with calculation of the computer generated ejection
fraction of radiotracer from the gallbladder. The patient
experienced abdominal discomfort with the oral Ensure consumption.
The computer generated ejection fraction of radiotracer from the
gallbladder is normal at 94%, normal greater than 33% using the oral
agent.
IMPRESSION: Normal ejection fraction of radiotracer from the gallbladder.
Patient experienced abdominal discomfort with the oral Ensure
consumption. Cystic and common bile ducts are patent as is evidenced
by visualization of gallbladder and small bowel.

## 2023-01-12 NOTE — Progress Notes (Unsigned)
[facility] [address] [place] [address] Phone: [phone] Subjective:   David Schultz, am serving as a scribe for Dr. Antoine Schultz.  I'm seeing this patient by the request  of:  Schultz, David Seltzer., MD  CC: Low back pain  [id number]  David Schultz is a 37 y.o. male coming in with complaint of lower back pain. 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 2 months went to chiropractor and urgernt care. Said sciatic nerve on R side, anti inflammatory and muscle relaxer. R is worse than L and radiating pain sometimes. Pain is annoying, not sharp, but grabs him. Lying flat then sitting up hurts.       Past Medical History:  Diagnosis Date   Atypical chest pain 07/05/2017   Dyspnea on exertion 07/05/2017   Hypertriglyceridemia 08/05/2017   Obesity    UTI (urinary tract infection)    Past Surgical History:  Procedure Laterality Date   Sweat Gland removal     on back of head. Happened in middle school    Social History   Socioeconomic History   Marital status: Single    Spouse name: Not on file   Number of children: Not on file   Years of education: Not on file   Highest education level: Not on file  Occupational History   Not on file  Tobacco Use   Smoking status: Never   Smokeless tobacco: Never  Vaping Use   Vaping status: Never Used  Substance and Sexual Activity   Alcohol use: No   Drug use: No   Sexual activity: Not on file  Other Topics Concern   Not on file  Social History Narrative   Not on file   Social Determinants of Health   Financial Resource Strain: Not on file  Food Insecurity: Not on file  Transportation Needs: Not on file  Physical Activity: Not on file  Stress: Not on file  Social Connections: Not on file   No Known Allergies Family History  Problem Relation Age of Onset   Diabetes Father    Prostate cancer Paternal Grandfather    Colon cancer Neg Hx    Esophageal cancer Neg Hx       Current  Outpatient Medications (Respiratory):    loratadine (CLARITIN) 10 MG tablet, Take 10 mg by mouth daily as needed for allergies.    Current Outpatient Medications (Other):    gabapentin (NEURONTIN) 100 MG capsule, Take 2 capsules (200 mg total) by mouth at bedtime.   FIBER PO, Take 2 tablets by mouth daily. vitafusion fiber sugar free   pantoprazole (PROTONIX) 40 MG tablet, Take 1 tablet (40 mg total) by mouth daily. 1/2 hour before breakfast.   Reviewed prior external information including notes and imaging from  primary care provider As well as notes that were available from care everywhere and other healthcare systems.  Past medical history, social, surgical and family history all reviewed in electronic medical record.  No pertanent information unless stated regarding to the chief complaint.   Review of Systems:  No headache, visual changes, nausea, vomiting, diarrhea, constipation, dizziness, abdominal pain, skin rash, fevers, chills, night sweats, weight loss, swollen lymph nodes, body aches, joint swelling, chest pain, shortness of breath, mood changes. POSITIVE muscle aches  Objective  Blood pressure 122/70, pulse (!) 104, height 6\' 2"  (1.88 m), weight 290 lb (131.5 kg), SpO2 97%.   General: No apparent distress alert and oriented x3 mood and affect normal, dressed appropriately.  HEENT: Pupils equal, extraocular movements intact  Respiratory: Patient's speak in full sentences and does not appear short of breath  Cardiovascular: No lower extremity edema, non tender, no erythema   Low back exam shows patient does have what appears to be scoliosis.  Does have a right sidebending and left rotation of the thoracic spine and a very small secondary curve of the lumbar spine.  Seems to be right at the thoracolumbar junction with the most discomfort is in some of that is midline.  No worsening pain with extension of the back.  [id number]; 15 additional minutes spent for Therapeutic exercises  as stated in above notes.  This included exercises focusing on stretching, strengthening, with significant focus on eccentric aspects.   Long term goals include an improvement in range of motion, strength, endurance as well as avoiding reinjury. Patient's frequency would include in 1-2 times a day, 3-5 times a week for a duration of 6-12 weeks. Low back exercises that included:  Pelvic tilt/bracing instruction to focus on control of the pelvic girdle and lower abdominal muscles  Glute strengthening exercises, focusing on proper firing of the glutes without engaging the low back muscles Proper stretching techniques for maximum relief for the hamstrings, hip flexors, low back and some rotation where tolerated  Proper technique shown and discussed handout in great detail with ATC.  All questions were discussed and answered.     Impression and Recommendations:    The above documentation has been reviewed and is accurate and complete David Saa, DO

## 2023-01-13 ENCOUNTER — Ambulatory Visit (INDEPENDENT_AMBULATORY_CARE_PROVIDER_SITE_OTHER): Payer: Self-pay | Admitting: Family Medicine

## 2023-01-13 ENCOUNTER — Ambulatory Visit (INDEPENDENT_AMBULATORY_CARE_PROVIDER_SITE_OTHER): Payer: Self-pay

## 2023-01-13 ENCOUNTER — Encounter: Payer: Self-pay | Admitting: Family Medicine

## 2023-01-13 VITALS — BP 122/70 | HR 104 | Ht 74.0 in | Wt 290.0 lb

## 2023-01-13 DIAGNOSIS — M545 Low back pain, unspecified: Secondary | ICD-10-CM | POA: Insufficient documentation

## 2023-01-13 DIAGNOSIS — M546 Pain in thoracic spine: Secondary | ICD-10-CM

## 2023-01-13 MED ORDER — GABAPENTIN 100 MG PO CAPS: 200.0000 mg | ORAL_CAPSULE | Freq: Every day | ORAL | 0 refills | Status: DC

## 2023-01-13 NOTE — Assessment & Plan Note (Signed)
Patient does have thoracolumbar pain.  Does seem to have some scoliosis noted at baseline.  Has never been told this previously.  Do feel like there is a possibility of other bony abnormalities that could be contributing so we will get an x-ray.  Will start on gabapentin to see if this will be beneficial.  Discussed core strengthening exercises and work with Event organiser.  Follow-up with me again in 4 to 6 weeks.  Could be a candidate for osteopathic manipulation.

## 2023-01-13 NOTE — Patient Instructions (Addendum)
Xray today Gabapentin 200mg  Do prescribed exercises at least 3x a week See you again in 5-6 weeks

## 2023-01-21 ENCOUNTER — Ambulatory Visit: Payer: Self-pay | Admitting: Family Medicine

## 2023-02-17 NOTE — Progress Notes (Signed)
[facility] [address] [place] [address] Phone: [phone] Subjective:   [name], am serving as a scribe for Dr. Antoine Primas.  I'm seeing this patient by the request  of:  David Done., MD  CC: Low back pain follow-up  VOZ:[id number]  01/13/2023 Patient does have thoracolumbar pain.  Does seem to have some scoliosis noted at baseline.  Has never been told this previously.  Do feel like there is a possibility of other bony abnormalities that could be contributing so we will get an x-ray.  Will start on gabapentin to see if this will be beneficial.  Discussed core strengthening exercises and work with Event organiser.  Follow-up with me again in 4 to 6 weeks.  Could be a candidate for osteopathic manipulation.      Update 02/18/2023 David Schultz is a 37 y.o. male coming in with complaint of thoracolumbar pain. Patient states has been doing better. Overall better than before. Still has a problem or two.  X-rays of the lumbar spine were independently visualized by me showing that there was some mild spurring at the T11-12 as well as some facet arthropathy at L5-S1.    Past Medical History:  Diagnosis Date   Atypical chest pain 07/05/2017   Dyspnea on exertion 07/05/2017   Hypertriglyceridemia 08/05/2017   Obesity    UTI (urinary tract infection)    Past Surgical History:  Procedure Laterality Date   Sweat Gland removal     on back of head. Happened in middle school    Social History   Socioeconomic History   Marital status: Single    Spouse name: Not on file   Number of children: Not on file   Years of education: Not on file   Highest education level: Not on file  Occupational History   Not on file  Tobacco Use   Smoking status: Never   Smokeless tobacco: Never  Vaping Use   Vaping status: Never Used  Substance and Sexual Activity   Alcohol use: No   Drug use: No   Sexual activity: Not on file  Other Topics  Concern   Not on file  Social History Narrative   Not on file   Social Determinants of Health   Financial Resource Strain: Not on file  Food Insecurity: Not on file  Transportation Needs: Not on file  Physical Activity: Not on file  Stress: Not on file  Social Connections: Not on file   No Known Allergies Family History  Problem Relation Age of Onset   Diabetes Father    Prostate cancer Paternal Grandfather    Colon cancer Neg Hx    Esophageal cancer Neg Hx       Current Outpatient Medications (Respiratory):    loratadine (CLARITIN) 10 MG tablet, Take 10 mg by mouth daily as needed for allergies.    Current Outpatient Medications (Other):    FIBER PO, Take 2 tablets by mouth daily. vitafusion fiber sugar free   gabapentin (NEURONTIN) 100 MG capsule, Take 2 capsules (200 mg total) by mouth at bedtime.   pantoprazole (PROTONIX) 40 MG tablet, Take 1 tablet (40 mg total) by mouth daily. 1/2 hour before breakfast.   Objective  Blood pressure 122/84, pulse 83, height 6\' 2"  (1.88 m), weight 296 lb (134.3 kg), SpO2 97%.   General: No apparent distress alert and oriented x3 mood and affect normal, dressed appropriately.  HEENT: Pupils equal, extraocular movements intact  Respiratory: Patient's speak  in full sentences and does not appear short of breath  Cardiovascular: No lower extremity edema, non tender, no erythema  Low back exam does have some mild loss of lordosis.  Nothing on this seems to be significantly severe at the time.  Sitting much more comfortably than last time.  Negative FABER test.  Able to get up from a seated position without any difficulties.    Impression and Recommendations:     The above documentation has been reviewed and is accurate and complete Judi Saa, DO

## 2023-02-18 ENCOUNTER — Encounter: Payer: Self-pay | Admitting: Family Medicine

## 2023-02-18 ENCOUNTER — Ambulatory Visit (INDEPENDENT_AMBULATORY_CARE_PROVIDER_SITE_OTHER): Payer: Self-pay | Admitting: Family Medicine

## 2023-02-18 VITALS — BP 122/84 | HR 83 | Ht 74.0 in | Wt 296.0 lb

## 2023-02-18 DIAGNOSIS — M546 Pain in thoracic spine: Secondary | ICD-10-CM

## 2023-02-18 DIAGNOSIS — M545 Low back pain, unspecified: Secondary | ICD-10-CM

## 2023-02-18 NOTE — Patient Instructions (Signed)
Pars defect Facet arthropathy Should do well over all with stretches and exercise Continue gabapentin and can use as needed See you again in 2-3 months just in case

## 2023-02-18 NOTE — Assessment & Plan Note (Signed)
Patient is doing well with conservative therapy and the gabapentin.  We discussed with patient about the possibility of osteopathic manipulation but will hold with patient doing so well.  Patient has not had any exacerbation for quite some time and hopeful that this will continue.  Increase activity slowly.  Follow-up again in 3 months otherwise

## 2023-04-04 ENCOUNTER — Other Ambulatory Visit: Payer: Self-pay | Admitting: Family Medicine

## 2023-04-23 NOTE — Progress Notes (Signed)
 [name] [address] [place] [address] Phone: [phone] Subjective:   David Schultz, am serving as a scribe for Dr. Arthea Claudene.  I'm seeing this patient by the request  of:  Sabas Norleen PARAS., MD  CC: back exam shows  YEP:Dlagzrupcz  02/18/2023 Patient is doing well with conservative therapy and the gabapentin . We discussed with patient about the possibility of osteopathic manipulation but will hold with patient doing so well. Patient has not had any exacerbation for quite some time and hopeful that this will continue. Increase activity slowly. Follow-up again in 3 months otherwise   Update 04/28/2023 David Schultz is a 38 y.o. male coming in with complaint of thoracolumbar spine pain. Patient states the low back is better than it was but if moving in certain way it feels like he is hitting a nerve. Worse pain when he gets home from work and lays down and tries to get up.       Past Medical History:  Diagnosis Date   Atypical chest pain 07/05/2017   Dyspnea on exertion 07/05/2017   Hypertriglyceridemia 08/05/2017   Obesity    UTI (urinary tract infection)    Past Surgical History:  Procedure Laterality Date   Sweat Gland removal     on back of head. Happened in middle school    Social History   Socioeconomic History   Marital status: Single    Spouse name: Not on file   Number of children: Not on file   Years of education: Not on file   Highest education level: Not on file  Occupational History   Not on file  Tobacco Use   Smoking status: Never   Smokeless tobacco: Never  Vaping Use   Vaping status: Never Used  Substance and Sexual Activity   Alcohol use: No   Drug use: No   Sexual activity: Not on file  Other Topics Concern   Not on file  Social History Narrative   Not on file   Social Drivers of Health   Financial Resource Strain: Not on file  Food Insecurity: Not on file  Transportation Needs: Not on file   Physical Activity: Not on file  Stress: Not on file  Social Connections: Not on file   No Known Allergies Family History  Problem Relation Age of Onset   Diabetes Father    Prostate cancer Paternal Grandfather    Colon cancer Neg Hx    Esophageal cancer Neg Hx       Current Outpatient Medications (Respiratory):    loratadine (CLARITIN) 10 MG tablet, Take 10 mg by mouth daily as needed for allergies.    Current Outpatient Medications (Other):    FIBER PO, Take 2 tablets by mouth daily. vitafusion fiber sugar free   gabapentin  (NEURONTIN ) 100 MG capsule, TAKE 2 CAPSULES BY MOUTH AT BEDTIME   pantoprazole  (PROTONIX ) 40 MG tablet, Take 1 tablet (40 mg total) by mouth daily. 1/2 hour before breakfast.   Reviewed prior external information including notes and imaging from  primary care provider As well as notes that were available from care everywhere and other healthcare systems.  Past medical history, social, surgical and family history all reviewed in electronic medical record.  No pertanent information unless stated regarding to the chief complaint.   Review of Systems:  No headache, visual changes, nausea, vomiting, diarrhea, constipation, dizziness, abdominal pain, skin rash, fevers, chills, night sweats, weight loss, swollen lymph nodes, body aches, joint swelling,  chest pain, shortness of breath, mood changes. POSITIVE muscle aches  Objective  Blood pressure 130/86, pulse 72, height 6' 2 (1.88 m), SpO2 99%.   General: No apparent distress alert and oriented x3 mood and affect normal, dressed appropriately.  HEENT: Pupils equal, extraocular movements intact  Respiratory: Patient's speak in full sentences and does not appear short of breath  Cardiovascular: No lower extremity edema, non tender, no erythema  Back exam shows scoliosis noted.  Patient does have significant tightness to the lumbar area.  Some.  Tightness with FABER test noted.  Patient does have  neurovascularly intact distally.  Osteopathic findings T9 extended rotated and side bent left L3 flexed rotated and side bent right Sacrum right on right     Impression and Recommendations:    The above documentation has been reviewed and is accurate and complete Janei Scheff M Manmeet Arzola, DO

## 2023-04-28 ENCOUNTER — Encounter: Payer: Self-pay | Admitting: Family Medicine

## 2023-04-28 ENCOUNTER — Ambulatory Visit (INDEPENDENT_AMBULATORY_CARE_PROVIDER_SITE_OTHER): Payer: Self-pay | Admitting: Family Medicine

## 2023-04-28 VITALS — BP 130/86 | HR 72 | Ht 74.0 in

## 2023-04-28 DIAGNOSIS — M9902 Segmental and somatic dysfunction of thoracic region: Secondary | ICD-10-CM

## 2023-04-28 DIAGNOSIS — M9903 Segmental and somatic dysfunction of lumbar region: Secondary | ICD-10-CM

## 2023-04-28 DIAGNOSIS — M546 Pain in thoracic spine: Secondary | ICD-10-CM

## 2023-04-28 DIAGNOSIS — M545 Low back pain, unspecified: Secondary | ICD-10-CM

## 2023-04-28 DIAGNOSIS — M2550 Pain in unspecified joint: Secondary | ICD-10-CM

## 2023-04-28 DIAGNOSIS — M9904 Segmental and somatic dysfunction of sacral region: Secondary | ICD-10-CM

## 2023-04-28 NOTE — Assessment & Plan Note (Signed)

## 2023-04-28 NOTE — Assessment & Plan Note (Signed)
 Continues to have the thoracolumbar pain.  Does have some scoliosis at baseline.  We discussed again about potential imaging and labs but patient is self-pay so we will look into pricing depending on the pricing we may go through with this.  Close to him.  Follow-up with me again in 2 months

## 2023-04-28 NOTE — Addendum Note (Signed)
 Addended by: Debbe Odea R on: 04/28/2023 11:20 AM   Modules accepted: Orders

## 2023-04-28 NOTE — Patient Instructions (Signed)
 In 48 hours if not better increase to 3 pills Do that for a week if you like that call us for a prescription We will get back to you on the lab cost See me again in 2-3 months

## 2023-07-24 ENCOUNTER — Other Ambulatory Visit: Payer: Self-pay | Admitting: Family Medicine

## 2023-07-26 NOTE — Progress Notes (Unsigned)
 [name] [address] [place] [address] Phone: [phone] Subjective:   Morene Antu am a scribe for Dr. Katrinka Blazing.   I'm seeing this patient by the request  of:  [name], Excell Seltzer., MD  CC: back and neck pain follow up   [id number]  David Schultz is a 38 y.o. male coming in with complaint of back pain. OMT 05/08/2023. Patient states back of neck is hurting. Pain shooting down the right arm. Low back has pain that comes and goes. The primary issue today is the pain going down the arm from neck to the elbow in the bicep area. Driving makes it worse. Woke up one morning with the pain cause unknown. Gripping things and holding things are an issue as well. Went two months without having to use the gabapentin.   Medications patient has been prescribed: Gabapentin  Taking: yes      X-rays of the lumbar spine showed some L5-S1 facet arthropathy but otherwise fairly unremarkable.   Reviewed prior external information including notes and imaging from previsou exam, outside providers and external EMR if available.   As well as notes that were available from care everywhere and other healthcare systems.  Past medical history, social, surgical and family history all reviewed in electronic medical record.  No pertanent information unless stated regarding to the chief complaint.   Past Medical History:  Diagnosis Date   Atypical chest pain 07/05/2017   Dyspnea on exertion 07/05/2017   Hypertriglyceridemia 08/05/2017   Obesity    UTI (urinary tract infection)     No Known Allergies   Review of Systems:  No headache, visual changes, nausea, vomiting, diarrhea, constipation, dizziness, abdominal pain, skin rash, fevers, chills, night sweats, weight loss, swollen lymph nodes, body aches, joint swelling, chest pain, shortness of breath, mood changes. POSITIVE muscle aches  Objective  Blood pressure 138/80, pulse 87, height 6\' 2"  (1.88 m), weight  294 lb (133.4 kg), SpO2 96%.   General: No apparent distress alert and oriented x3 mood and affect normal, dressed appropriately.  HEENT: Pupils equal, extraocular movements intact  Respiratory: Patient's speak in full sentences and does not appear short of breath  Cardiovascular: No lower extremity edema, non tender, no erythema  Gait relatively normal MSK:  Back does have increased kyphosis of the upper back.  Does have some tenderness noted as well. Neck exam does have some loss lordosis.  Some positive Spurling's noted in the right arm in the C5-C6 distribution. Osteopathic findings  C2 flexed rotated and side bent right C6 flexed rotated and side bent right T3 extended rotated and side bent right inhaled rib T9 extended rotated and side bent left L2 flexed rotated and side bent right L5 flexed rotated and side bent left Sacrum right on right     Assessment and Plan:  Thoracolumbar back pain Thoracic back pain noted.  Attempted osteopathic manipulation.  No concerning patient is having more neck pain as well.  Discussed icing regimen and home exercises, which activities to do and which ones to avoid.  Follow-up again in 6 to 8 weeks due to some of the radicular symptoms started gabapentin as well as meloxicam again.  Gabapentin 200 mg at night and meloxicam 15 mg.  X-rays are pending  Cervical disc disorder with radiculopathy of cervical region As stated above    Nonallopathic problems  Decision today to treat with OMT was based on Physical Exam  After verbal consent patient was treated with  ME, FPR techniques in thoracic, lumbar, and sacral  areas  Patient tolerated the procedure well with improvement in symptoms  Patient given exercises, stretches and lifestyle modifications  See medications in patient instructions if given  Patient will follow up in 4-8 weeks    The above documentation has been reviewed and is accurate and complete Judi Saa, DO        Note: This dictation was prepared with Dragon dictation along with smaller phrase technology. Any transcriptional errors that result from this process are unintentional.

## 2023-07-27 ENCOUNTER — Encounter: Payer: Self-pay | Admitting: Family Medicine

## 2023-07-27 ENCOUNTER — Ambulatory Visit: Payer: Self-pay | Admitting: Family Medicine

## 2023-07-27 ENCOUNTER — Ambulatory Visit (INDEPENDENT_AMBULATORY_CARE_PROVIDER_SITE_OTHER): Payer: Self-pay

## 2023-07-27 VITALS — BP 138/80 | HR 87 | Ht 74.0 in | Wt 294.0 lb

## 2023-07-27 DIAGNOSIS — M9903 Segmental and somatic dysfunction of lumbar region: Secondary | ICD-10-CM

## 2023-07-27 DIAGNOSIS — M542 Cervicalgia: Secondary | ICD-10-CM

## 2023-07-27 DIAGNOSIS — M5010 Cervical disc disorder with radiculopathy, unspecified cervical region: Secondary | ICD-10-CM | POA: Insufficient documentation

## 2023-07-27 DIAGNOSIS — M9901 Segmental and somatic dysfunction of cervical region: Secondary | ICD-10-CM

## 2023-07-27 DIAGNOSIS — M9908 Segmental and somatic dysfunction of rib cage: Secondary | ICD-10-CM

## 2023-07-27 DIAGNOSIS — M9904 Segmental and somatic dysfunction of sacral region: Secondary | ICD-10-CM

## 2023-07-27 DIAGNOSIS — M545 Low back pain, unspecified: Secondary | ICD-10-CM

## 2023-07-27 DIAGNOSIS — M9902 Segmental and somatic dysfunction of thoracic region: Secondary | ICD-10-CM

## 2023-07-27 DIAGNOSIS — M546 Pain in thoracic spine: Secondary | ICD-10-CM

## 2023-07-27 MED ORDER — MELOXICAM 15 MG PO TABS: 15.0000 mg | ORAL_TABLET | Freq: Every day | ORAL | 0 refills | Status: AC

## 2023-07-27 MED ORDER — GABAPENTIN 100 MG PO CAPS: 200.0000 mg | ORAL_CAPSULE | Freq: Every day | ORAL | 0 refills | Status: DC

## 2023-07-27 NOTE — Assessment & Plan Note (Signed)
 As stated above.

## 2023-07-27 NOTE — Assessment & Plan Note (Addendum)
 Thoracic back pain noted.  Attempted osteopathic manipulation.  No concerning patient is having more neck pain as well.  Discussed icing regimen and home exercises, which activities to do and which ones to avoid.  Follow-up again in 6 to 8 weeks due to some of the radicular symptoms started gabapentin as well as meloxicam again.  Gabapentin 200 mg at night and meloxicam 15 mg.  X-rays are pending

## 2023-07-27 NOTE — Patient Instructions (Addendum)
 Good to see you. X-rays on the way out.  Gabapentin refilled. Meloxicam 15 mg daily for 10 days Hep back. Return 5 to 6 weeks.

## 2023-09-08 NOTE — Progress Notes (Deleted)
  [name] [address] [place] [address] Phone: [phone] Subjective:    I'm seeing this patient by the request  of:  [name], [name], MD  CC:   [id number]  David Schultz is a 38 y.o. male coming in with complaint of back and neck pain. OMT 07/27/2023. Patient states   Medications patient has been prescribed: Gabapentin   Taking:         Reviewed prior external information including notes and imaging from previsou exam, outside providers and external EMR if available.   As well as notes that were available from care everywhere and other healthcare systems.  Past medical history, social, surgical and family history all reviewed in electronic medical record.  No pertanent information unless stated regarding to the chief complaint.   Past Medical History:  Diagnosis Date   Atypical chest pain 07/05/2017   Dyspnea on exertion 07/05/2017   Hypertriglyceridemia 08/05/2017   Obesity    UTI (urinary tract infection)     No Known Allergies   Review of Systems:  No headache, visual changes, nausea, vomiting, diarrhea, constipation, dizziness, abdominal pain, skin rash, fevers, chills, night sweats, weight loss, swollen lymph nodes, body aches, joint swelling, chest pain, shortness of breath, mood changes. POSITIVE muscle aches  Objective  There were no vitals taken for this visit.   General: No apparent distress alert and oriented x3 mood and affect normal, dressed appropriately.  HEENT: Pupils equal, extraocular movements intact  Respiratory: Patient's speak in full sentences and does not appear short of breath  Cardiovascular: No lower extremity edema, non tender, no erythema  Gait MSK:  Back   Osteopathic findings  C2 flexed rotated and side bent right C6 flexed rotated and side bent left T3 extended rotated and side bent right inhaled rib T9 extended rotated and side bent left L2 flexed rotated and side bent right Sacrum  right on right       Assessment and Plan:  No problem-specific Assessment & Plan notes found for this encounter.    Nonallopathic problems  Decision today to treat with OMT was based on Physical Exam  After verbal consent patient was treated with HVLA, ME, FPR techniques in cervical, rib, thoracic, lumbar, and sacral  areas  Patient tolerated the procedure well with improvement in symptoms  Patient given exercises, stretches and lifestyle modifications  See medications in patient instructions if given  Patient will follow up in 4-8 weeks             Note: This dictation was prepared with Dragon dictation along with smaller phrase technology. Any transcriptional errors that result from this process are unintentional.

## 2023-09-09 ENCOUNTER — Ambulatory Visit: Payer: Self-pay | Admitting: Family Medicine

## 2023-10-06 ENCOUNTER — Ambulatory Visit (INDEPENDENT_AMBULATORY_CARE_PROVIDER_SITE_OTHER): Payer: Self-pay | Admitting: Sports Medicine

## 2023-10-06 VITALS — BP 122/84 | HR 78 | Ht 74.0 in | Wt 286.0 lb

## 2023-10-06 DIAGNOSIS — M9905 Segmental and somatic dysfunction of pelvic region: Secondary | ICD-10-CM

## 2023-10-06 DIAGNOSIS — M542 Cervicalgia: Secondary | ICD-10-CM

## 2023-10-06 DIAGNOSIS — M9902 Segmental and somatic dysfunction of thoracic region: Secondary | ICD-10-CM

## 2023-10-06 DIAGNOSIS — M545 Low back pain, unspecified: Secondary | ICD-10-CM

## 2023-10-06 DIAGNOSIS — G8929 Other chronic pain: Secondary | ICD-10-CM

## 2023-10-06 DIAGNOSIS — M9901 Segmental and somatic dysfunction of cervical region: Secondary | ICD-10-CM

## 2023-10-06 DIAGNOSIS — M9903 Segmental and somatic dysfunction of lumbar region: Secondary | ICD-10-CM

## 2023-10-06 DIAGNOSIS — M9908 Segmental and somatic dysfunction of rib cage: Secondary | ICD-10-CM

## 2023-10-06 DIAGNOSIS — M546 Pain in thoracic spine: Secondary | ICD-10-CM

## 2023-10-06 MED ORDER — MELOXICAM 15 MG PO TABS: 15.0000 mg | ORAL_TABLET | Freq: Every day | ORAL | 0 refills | Status: AC

## 2023-10-06 MED ORDER — GABAPENTIN 100 MG PO CAPS: 200.0000 mg | ORAL_CAPSULE | Freq: Every day | ORAL | 0 refills | Status: DC

## 2023-10-06 NOTE — Patient Instructions (Signed)
-   Start meloxicam  15 mg daily x2 weeks.  If still having pain after 2 weeks, complete 3rd-week of NSAID. May use remaining NSAID as needed once daily for pain control.  Do not to use additional over-the-counter NSAIDs (ibuprofen, naproxen, Advil, Aleve, etc.) while taking prescription NSAIDs.  May use Tylenol [phone] mg 2 to 3 times a day for breakthrough pain. Low back HEP  4-6 week follow up with either Dr. Cleora Daft or Dr. Felipe Horton

## 2023-10-06 NOTE — Addendum Note (Signed)
 Addended by: Audie Bleacher R on: 10/06/2023 09:16 AM   Modules accepted: Orders

## 2023-10-06 NOTE — Progress Notes (Signed)
 Ben Belton Peplinski D[facility] [address] [place] [address] Phone: [phone]   Assessment and Plan:     1. Neck pain 2. Thoracolumbar back pain 3. Chronic bilateral low back pain without sciatica 4. Somatic dysfunction of cervical region 5. Somatic dysfunction of thoracic region 6. Somatic dysfunction of lumbar region 7. Somatic dysfunction of pelvic region 8. Somatic dysfunction of rib region -Chronic with exacerbation, subsequent visit - Consistent with recurrent musculoskeletal dysfunction primarily in neck and lower lumbar spine, likely due to physical activity at work - Recommend turmeric, tart cherry extract, healthy diet and hydration to decrease overall inflammation - Start meloxicam  15 mg daily x2 weeks.  If still having pain after 2 weeks, complete 3rd-week of NSAID. May use remaining NSAID as needed once daily for pain control.  Do not to use additional over-the-counter NSAIDs (ibuprofen, naproxen, Advil, Aleve, etc.) while taking prescription NSAIDs.  May use Tylenol [phone] mg 2 to 3 times a day for breakthrough pain. - Continue HEP and start new HEP targeting low back/lumbar region - May continue previously prescribed gabapentin  200 mg nightly - Patient has received relief with OMT in the past.  Elects for repeat OMT today.  Tolerated well per note below. - Decision today to treat with OMT was based on Physical Exam  After verbal consent patient was treated with HVLA (high velocity low amplitude), ME (muscle energy), FPR (flex positional release), ST (soft tissue), PC/PD (Pelvic Compression/ Pelvic Decompression) techniques in cervical, rib, thoracic, lumbar, and pelvic areas. Patient tolerated the procedure well with improvement in symptoms.  Patient educated on potential side effects of soreness and recommended to rest, hydrate, and use Tylenol as needed for pain control.    15 additional minutes spent for educating Therapeutic  Home Exercise Program.  This included exercises focusing on stretching, strengthening, with focus on eccentric aspects.   Long term goals include an improvement in range of motion, strength, endurance as well as avoiding reinjury. Patient's frequency would include in 1-2 times a day, 3-5 times a week for a duration of 6-12 weeks. Proper technique shown and discussed handout in great detail with ATC.  All questions were discussed and answered.    Pertinent previous records reviewed include none  Follow Up: 4 to 6 weeks with either myself or Dr. Felipe Horton for reevaluation.  Could consider repeat OMT.  If no improvement or worsening of symptoms, could consider physical therapy versus advanced imaging   Subjective:   I, Moenique Parris, am serving as a Neurosurgeon for Doctor Ulysees Gander  Chief Complaint: continued pain   HPI:  07/27/2023 Aamari Strawderman is a 38 y.o. male coming in with complaint of back pain. OMT 05/08/2023. Patient states back of neck is hurting. Pain shooting down the right arm. Low back has pain that comes and goes. The primary issue today is the pain going down the arm from neck to the elbow in the bicep area. Driving makes it worse. Woke up one morning with the pain cause unknown. Gripping things and holding things are an issue as well. Went two months without having to use the gabapentin .    Medications patient has been prescribed: Gabapentin   10/06/23 Patient states he had some relief last time he saw Dr. Felipe Horton. Pain came back about a week or so ago. Pain is intermittent. Cause of pain is different sometimes its ROM and others its just pain at rest. Low back pain intemittent    Relevant Historical Information: None  pertinent  Additional pertinent review of systems negative.   Current Outpatient Medications:    meloxicam  (MOBIC ) 15 MG tablet, Take 1 tablet (15 mg total) by mouth daily., Disp: 30 tablet, Rfl: 0   FIBER PO, Take 2 tablets by mouth daily. vitafusion fiber sugar  free, Disp: , Rfl:    gabapentin  (NEURONTIN ) 100 MG capsule, Take 2 capsules (200 mg total) by mouth at bedtime., Disp: 180 capsule, Rfl: 0   loratadine (CLARITIN) 10 MG tablet, Take 10 mg by mouth daily as needed for allergies., Disp: , Rfl:    pantoprazole  (PROTONIX ) 40 MG tablet, Take 1 tablet (40 mg total) by mouth daily. 1/2 hour before breakfast., Disp: 30 tablet, Rfl: 2   Objective:     Vitals:   10/06/23 0827  BP: 122/84  Pulse: 78  SpO2: 98%  Weight: 286 lb (129.7 kg)  Height: 6' 2 (1.88 m)      Body mass index is 36.72 kg/m.    Physical Exam:    General: Well-appearing, cooperative, sitting comfortably in no acute distress.   OMT Physical Exam:  ASIS Compression Test: Positive Right Cervical: TTP paraspinal, C5 RRSL Rib: Bilateral elevated first rib with TTP, worse on right Thoracic: TTP paraspinal, T5-8 RRSL Lumbar: TTP paraspinal, L1-3 RRSL.  Restricted motion and unable to obtain articulation Pelvis: Right anterior innominate    Electronically signed by:  Marshall Skeeter D.[facility] 9:11 AM 10/06/23

## 2023-11-16 NOTE — Progress Notes (Unsigned)
 [name] [address] [place] [address] Phone: [phone] Subjective:   ISusannah Schultz, am serving as a scribe for Dr. Arthea Claudene.  I'm seeing this patient by the request  of:  [name], Norleen PARAS., MD  CC: Mid and low back pain follow-up  YEP:Dlagzrupcz  David Schultz is a 38 y.o. male coming in with complaint of back and neck pain. OMT 4/28/205. Saw Dr. Leonce for OMT since last visit.  Neck pain at the last 2 visits.  Patient states progressively getting better. No new symptoms.  Medications patient has been prescribed: Gabapentin  (Dr. Leonce)  Taking:     Previous neck x-rays taken in April 2025 were unremarked.    Reviewed prior external information including notes and imaging from previsou exam, outside providers and external EMR if available.   As well as notes that were available from care everywhere and other healthcare systems.  Past medical history, social, surgical and family history all reviewed in electronic medical record.  No pertanent information unless stated regarding to the chief complaint.   Past Medical History:  Diagnosis Date   Atypical chest pain 07/05/2017   Dyspnea on exertion 07/05/2017   Hypertriglyceridemia 08/05/2017   Obesity    UTI (urinary tract infection)     No Known Allergies   Review of Systems:  No headache, visual changes, nausea, vomiting, diarrhea, constipation, dizziness, abdominal pain, skin rash, fevers, chills, night sweats, weight loss, swollen lymph nodes, body aches, joint swelling, chest pain, shortness of breath, mood changes. POSITIVE muscle aches  Objective  Blood pressure 124/82, pulse 85, height 6' 2 (1.88 m), weight 285 lb (129.3 kg), SpO2 96%.   General: No apparent distress alert and oriented x3 mood and affect normal, dressed appropriately.  HEENT: Pupils equal, extraocular movements intact  Respiratory: Patient's speak in full sentences and does not appear short of  breath  Cardiovascular: No lower extremity edema, non tender, no erythema  Gait MSK:  Back does have some loss of lordosis.  Neck exam though does have the limited sidebending bilaterally.  Some tightness noted   Osteopathic findings  C3 flexed rotated and side bent right C7 flexed rotated and side bent left T3 extended rotated and side bent right inhaled rib T7 extended rotated and side bent left T9 extended rotated and side bent left L1 flexed rotated and side bent right Sacrum right on right    Assessment and Plan:  Cervical disc disorder with radiculopathy of cervical region Continued significant arthritic changes noted with some tightness in the neck that goes into the parascapular area.  Discussed icing regimen and home exercises, discussed which activities to be muscular.  This activity slowly.  Discussed icing regimen.  Follow-up again in 6 to 8 weeks    Nonallopathic problems  Decision today to treat with OMT was based on Physical Exam  After verbal consent patient was treated with HVLA, ME, FPR techniques in cervical, rib, thoracic, lumbar, and sacral  areas  Patient tolerated the procedure well with improvement in symptoms  Patient given exercises, stretches and lifestyle modifications  See medications in patient instructions if given  Patient will follow up in 4-8 weeks     The above documentation has been reviewed and is accurate and complete David Schultz M David Golembeski, DO         Note: This dictation was prepared with Dragon dictation along with smaller phrase technology. Any transcriptional errors that result from this process are unintentional.

## 2023-11-17 ENCOUNTER — Encounter: Payer: Self-pay | Admitting: Family Medicine

## 2023-11-17 ENCOUNTER — Ambulatory Visit (INDEPENDENT_AMBULATORY_CARE_PROVIDER_SITE_OTHER): Payer: Self-pay | Admitting: Family Medicine

## 2023-11-17 VITALS — BP 124/82 | HR 85 | Ht 74.0 in | Wt 285.0 lb

## 2023-11-17 DIAGNOSIS — M5010 Cervical disc disorder with radiculopathy, unspecified cervical region: Secondary | ICD-10-CM

## 2023-11-17 DIAGNOSIS — M9903 Segmental and somatic dysfunction of lumbar region: Secondary | ICD-10-CM

## 2023-11-17 DIAGNOSIS — M9908 Segmental and somatic dysfunction of rib cage: Secondary | ICD-10-CM

## 2023-11-17 DIAGNOSIS — M9904 Segmental and somatic dysfunction of sacral region: Secondary | ICD-10-CM

## 2023-11-17 DIAGNOSIS — M9901 Segmental and somatic dysfunction of cervical region: Secondary | ICD-10-CM

## 2023-11-17 DIAGNOSIS — M9902 Segmental and somatic dysfunction of thoracic region: Secondary | ICD-10-CM

## 2023-11-17 NOTE — Assessment & Plan Note (Signed)
 Continued significant arthritic changes noted with some tightness in the neck that goes into the parascapular area.  Discussed icing regimen and home exercises, discussed which activities to be muscular.  This activity slowly.  Discussed icing regimen.  Follow-up again in 6 to 8 weeks

## 2023-11-17 NOTE — Patient Instructions (Signed)
 Good to see you! Enjoy the camping trip See you again in 2 months

## 2024-01-20 ENCOUNTER — Ambulatory Visit: Payer: Self-pay | Admitting: Family Medicine

## 2024-02-17 NOTE — Progress Notes (Unsigned)
  [facility] [address] [place] [address] Phone: [phone] Subjective:   David Schultz am a scribe for Dr. Claudene.   I'm seeing this patient by the request  of:  David Norleen PARAS., MD  CC: Back and neck pain  YEP:Dlagzrupcz  David Schultz is a 38 y.o. male coming in with complaint of back and neck pain. OMT 11/17/2023. Patient states mid-back has been aching for about two weeks now and the neck isn't that great today either.   Medications patient has been prescribed: None  Taking:         Reviewed prior external information including notes and imaging from previsou exam, outside providers and external EMR if available.   As well as notes that were available from care everywhere and other healthcare systems.  Past medical history, social, surgical and family history all reviewed in electronic medical record.  No pertanent information unless stated regarding to the chief complaint.   Past Medical History:  Diagnosis Date   Atypical chest pain 07/05/2017   Dyspnea on exertion 07/05/2017   Hypertriglyceridemia 08/05/2017   Obesity    UTI (urinary tract infection)     No Known Allergies   Review of Systems:  No headache, visual changes, nausea, vomiting, diarrhea, constipation, dizziness, abdominal pain, skin rash, fevers, chills, night sweats, weight loss, swollen lymph nodes, body aches, joint swelling, chest pain, shortness of breath, mood changes. POSITIVE muscle aches  Objective  Blood pressure 126/70, pulse 76, height 6' 2 (1.88 m), weight 294 lb (133.4 kg), SpO2 95%.   General: No apparent distress alert and oriented x3 mood and affect normal, dressed appropriately.  HEENT: Pupils equal, extraocular movements intact  Respiratory: Patient's speak in full sentences and does not appear short of breath  Cardiovascular: No lower extremity edema, non tender, no erythema  Gait MSK:  Back or pain in the thoracolumbar juncture  and more in the parascapular area.  More in the soft tissues than anything midline.  Relatively good range of motion.  Osteopathic findings  C2 flexed rotated and side bent right C6 flexed rotated and side bent left C5 flexed rotated sidebent right T3 extended rotated and side bent right inhaled rib T9 extended rotated and side bent left L2 flexed rotated and side bent right L3 flexed rotated and side bent left Sacrum right on right       Assessment and Plan:  Cervical disc disorder with radiculopathy of cervical region No scoliosis noted as well.  Discussed icing regimen and home exercises, discussed which activities to do and which ones to avoid.  Increase activity slowly.  Discussed icing regimen.  Follow-up again in 6 to 8 weeks otherwise. Refilled gabapentin  200 mg at night.  Nonallopathic problems  Decision today to treat with OMT was based on Physical Exam  After verbal consent patient was treated with HVLA, ME, FPR techniques in cervical, rib, thoracic, lumbar, and sacral  areas  Patient tolerated the procedure well with improvement in symptoms  Patient given exercises, stretches and lifestyle modifications  See medications in patient instructions if given  Patient will follow up in 4-8 weeks     The above documentation has been reviewed and is accurate and complete David Lahm M Lakecia Deschamps, DO         Note: This dictation was prepared with Dragon dictation along with smaller phrase technology. Any transcriptional errors that result from this process are unintentional.

## 2024-02-18 ENCOUNTER — Encounter: Payer: Self-pay | Admitting: Family Medicine

## 2024-02-18 ENCOUNTER — Ambulatory Visit (INDEPENDENT_AMBULATORY_CARE_PROVIDER_SITE_OTHER): Payer: Self-pay | Admitting: Family Medicine

## 2024-02-18 VITALS — BP 126/70 | HR 76 | Ht 74.0 in | Wt 294.0 lb

## 2024-02-18 DIAGNOSIS — M9901 Segmental and somatic dysfunction of cervical region: Secondary | ICD-10-CM

## 2024-02-18 DIAGNOSIS — M9902 Segmental and somatic dysfunction of thoracic region: Secondary | ICD-10-CM

## 2024-02-18 DIAGNOSIS — M5010 Cervical disc disorder with radiculopathy, unspecified cervical region: Secondary | ICD-10-CM

## 2024-02-18 DIAGNOSIS — M9903 Segmental and somatic dysfunction of lumbar region: Secondary | ICD-10-CM

## 2024-02-18 DIAGNOSIS — M9908 Segmental and somatic dysfunction of rib cage: Secondary | ICD-10-CM

## 2024-02-18 DIAGNOSIS — M9904 Segmental and somatic dysfunction of sacral region: Secondary | ICD-10-CM

## 2024-02-18 MED ORDER — GABAPENTIN 100 MG PO CAPS: 200.0000 mg | ORAL_CAPSULE | Freq: Every day | ORAL | 1 refills | Status: AC

## 2024-02-18 NOTE — Assessment & Plan Note (Signed)
 No scoliosis noted as well.  Discussed icing regimen and home exercises, discussed which activities to do and which ones to avoid.  Increase activity slowly.  Discussed icing regimen.  Follow-up again in 6 to 8 weeks otherwise.

## 2024-02-18 NOTE — Patient Instructions (Signed)
 Good to see you. Gabapentin  refill. See me again 2 to 3 months.

## 2024-04-19 NOTE — Progress Notes (Deleted)
" °  [facility] [address] [place] [address] Phone: [phone] Subjective:    I'm seeing this patient by the request  of:  Sabas Norleen PARAS., MD  CC: Neck and upper back pain follow-up  YEP:Dlagzrupcz  Alik Mawson is a 38 y.o. male coming in with complaint of back and neck pain. OMT 02/18/2024.  Patient states   Medications patient has been prescribed: Gabapentin   Taking:       X-rays taken in April of last year did not show any significant bony abnormality.  Reviewed prior external information including notes and imaging from previsou exam, outside providers and external EMR if available.   As well as notes that were available from care everywhere and other healthcare systems.  Past medical history, social, surgical and family history all reviewed in electronic medical record.  No pertanent information unless stated regarding to the chief complaint.   Past Medical History:  Diagnosis Date   Atypical chest pain 07/05/2017   Dyspnea on exertion 07/05/2017   Hypertriglyceridemia 08/05/2017   Obesity    UTI (urinary tract infection)     Allergies[1]   Review of Systems:  No headache, visual changes, nausea, vomiting, diarrhea, constipation, dizziness, abdominal pain, skin rash, fevers, chills, night sweats, weight loss, swollen lymph nodes, body aches, joint swelling, chest pain, shortness of breath, mood changes. POSITIVE muscle aches  Objective  There were no vitals taken for this visit.   General: No apparent distress alert and oriented x3 mood and affect normal, dressed appropriately.  HEENT: Pupils equal, extraocular movements intact  Respiratory: Patient's speak in full sentences and does not appear short of breath  Cardiovascular: No lower extremity edema, non tender, no erythema  Gait MSK:  Back   Osteopathic findings  C2 flexed rotated and side bent right C6 flexed rotated and side bent left T3 extended rotated and  side bent right inhaled rib T9 extended rotated and side bent left L2 flexed rotated and side bent right Sacrum right on right       Assessment and Plan:  No problem-specific Assessment & Plan notes found for this encounter.    Nonallopathic problems  Decision today to treat with OMT was based on Physical Exam  After verbal consent patient was treated with HVLA, ME, FPR techniques in cervical, rib, thoracic, lumbar, and sacral  areas  Patient tolerated the procedure well with improvement in symptoms  Patient given exercises, stretches and lifestyle modifications  See medications in patient instructions if given  Patient will follow up in 4-8 weeks    The above documentation has been reviewed and is accurate and complete Matylda Fehring M Jasson Siegmann, DO          Note: This dictation was prepared with Dragon dictation along with smaller phrase technology. Any transcriptional errors that result from this process are unintentional.            [1] No Known Allergies  "

## 2024-04-25 ENCOUNTER — Ambulatory Visit: Payer: Self-pay | Admitting: Family Medicine

## 2024-06-21 ENCOUNTER — Ambulatory Visit: Payer: Self-pay | Admitting: Family Medicine
